# Patient Record
Sex: Male | Born: 1990 | Race: White | Hispanic: No | Marital: Single | Smoking: Current every day smoker
Health system: Southern US, Community
[De-identification: ages and names within clinical notes are randomized; demographics above are authoritative.]

## PROBLEM LIST (undated history)

## (undated) DIAGNOSIS — F1990 Other psychoactive substance use, unspecified, uncomplicated: Secondary | ICD-10-CM

## (undated) HISTORY — PX: MANDIBLE FRACTURE SURGERY: SHX706

## (undated) HISTORY — PX: FINGER AMPUTATION: SHX636

---
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
## 2012-07-28 IMAGING — CR DG THORACIC SPINE 3V
3 series · 3 of 3 positions shown · non-contrast
Comparison: None.

CLINICAL DATA: Thoracic spine pain radiating to the right side of
the body.

THORACIC SPINE - 2 VIEW + SWIMMERS

[w swimmers view]
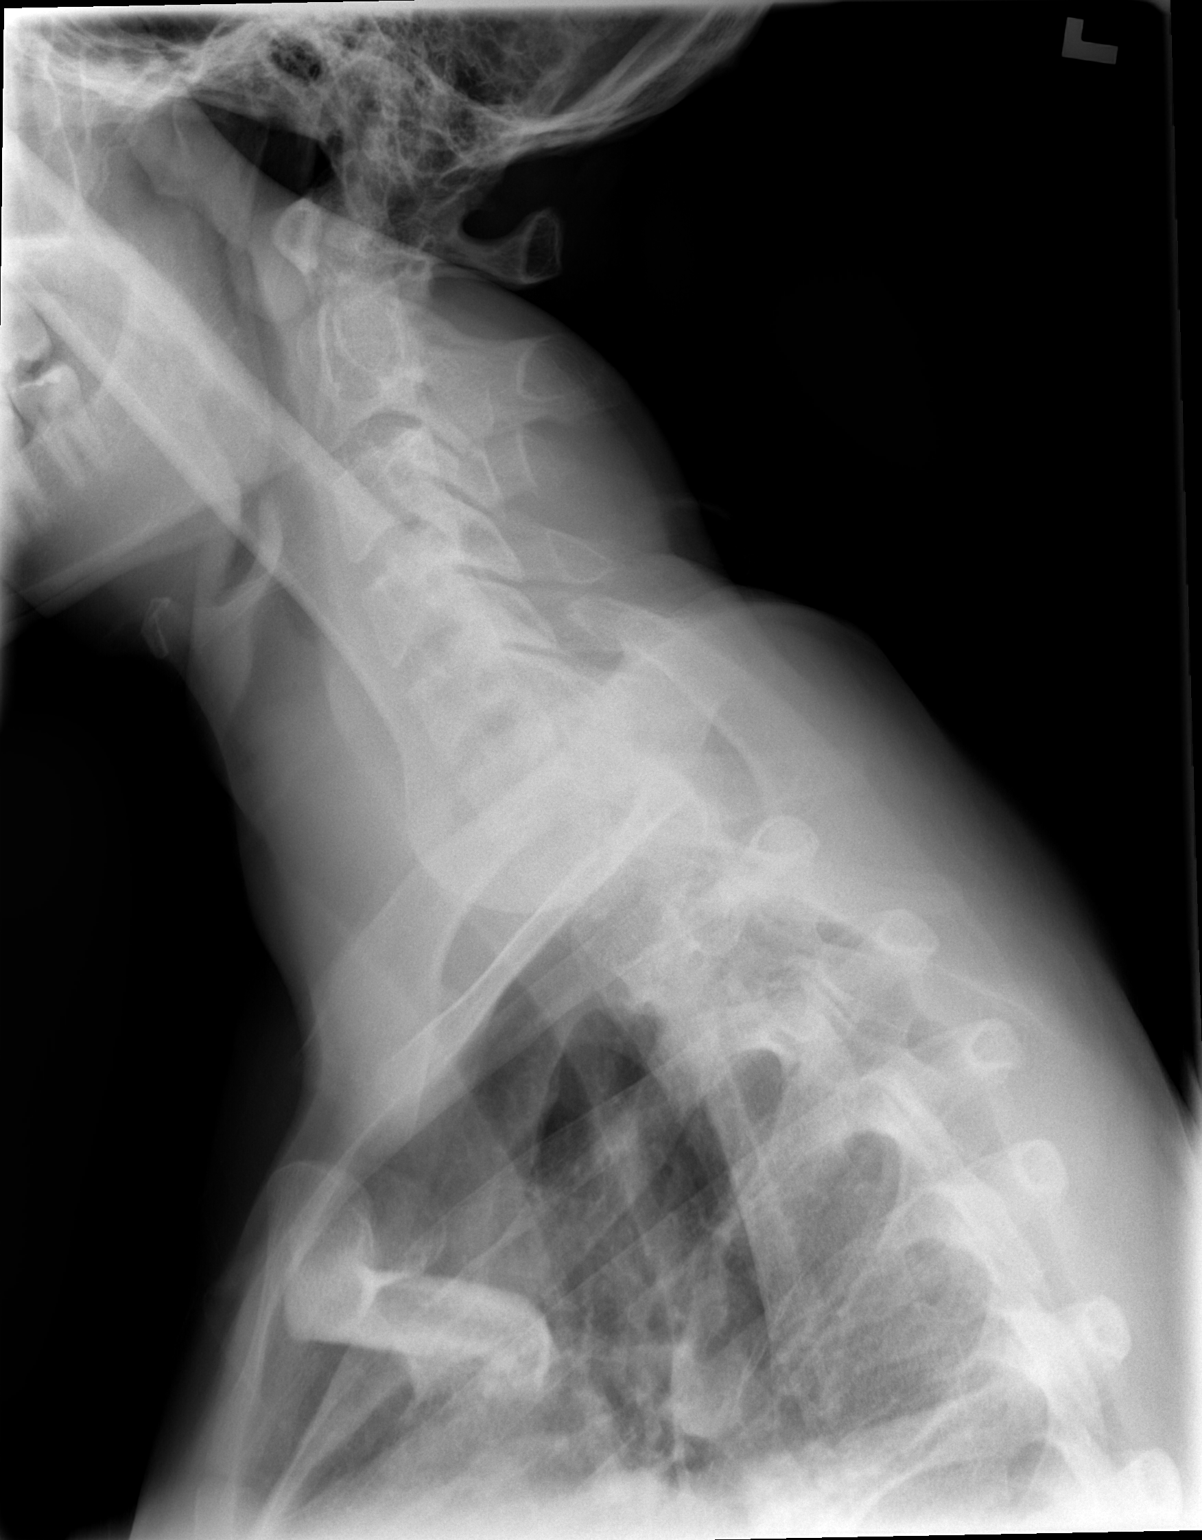

[t t-spine a.p.]
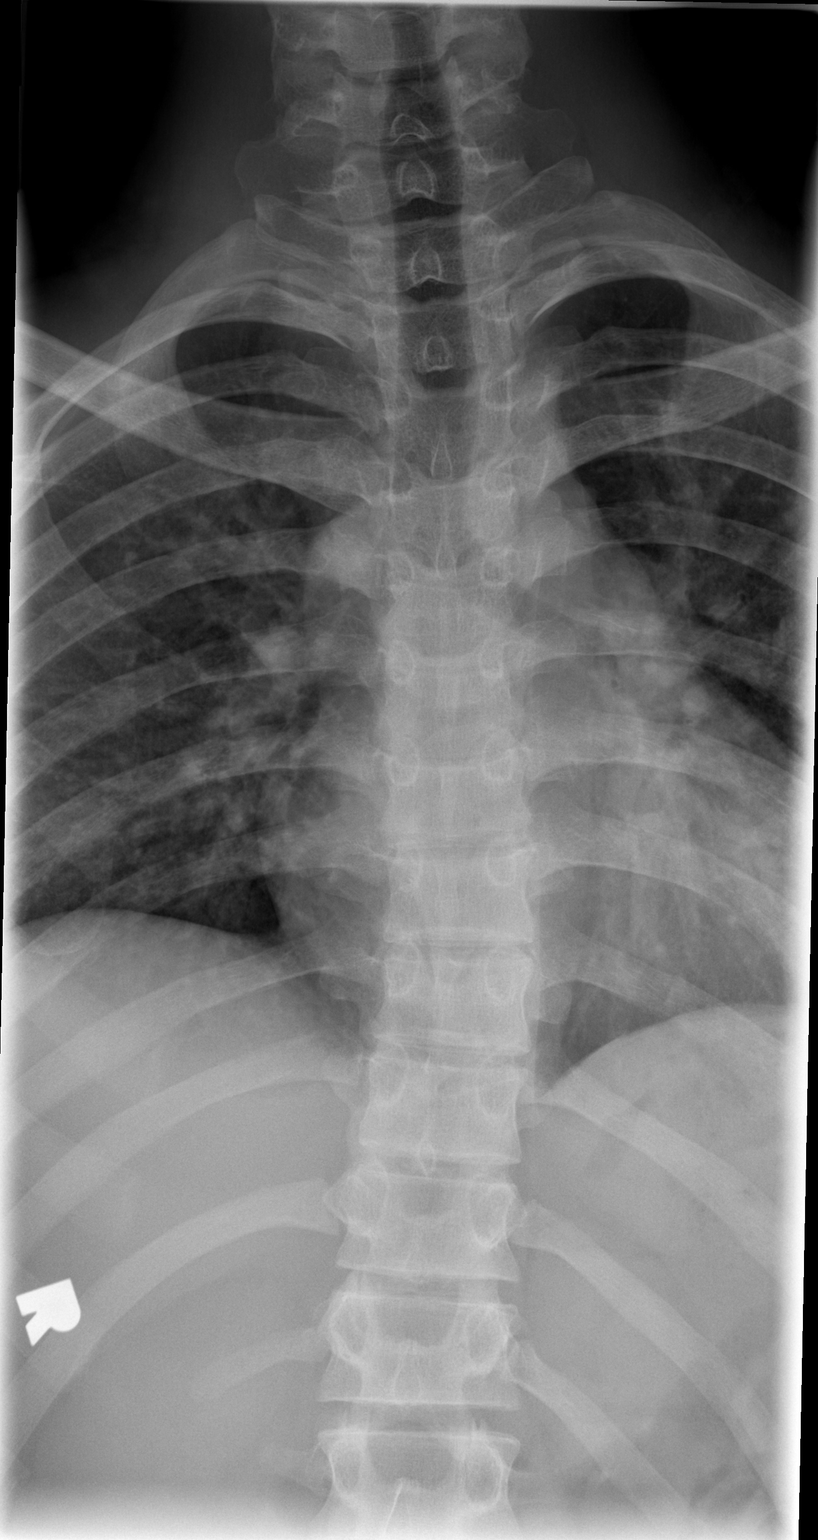

[t t-spine lat]
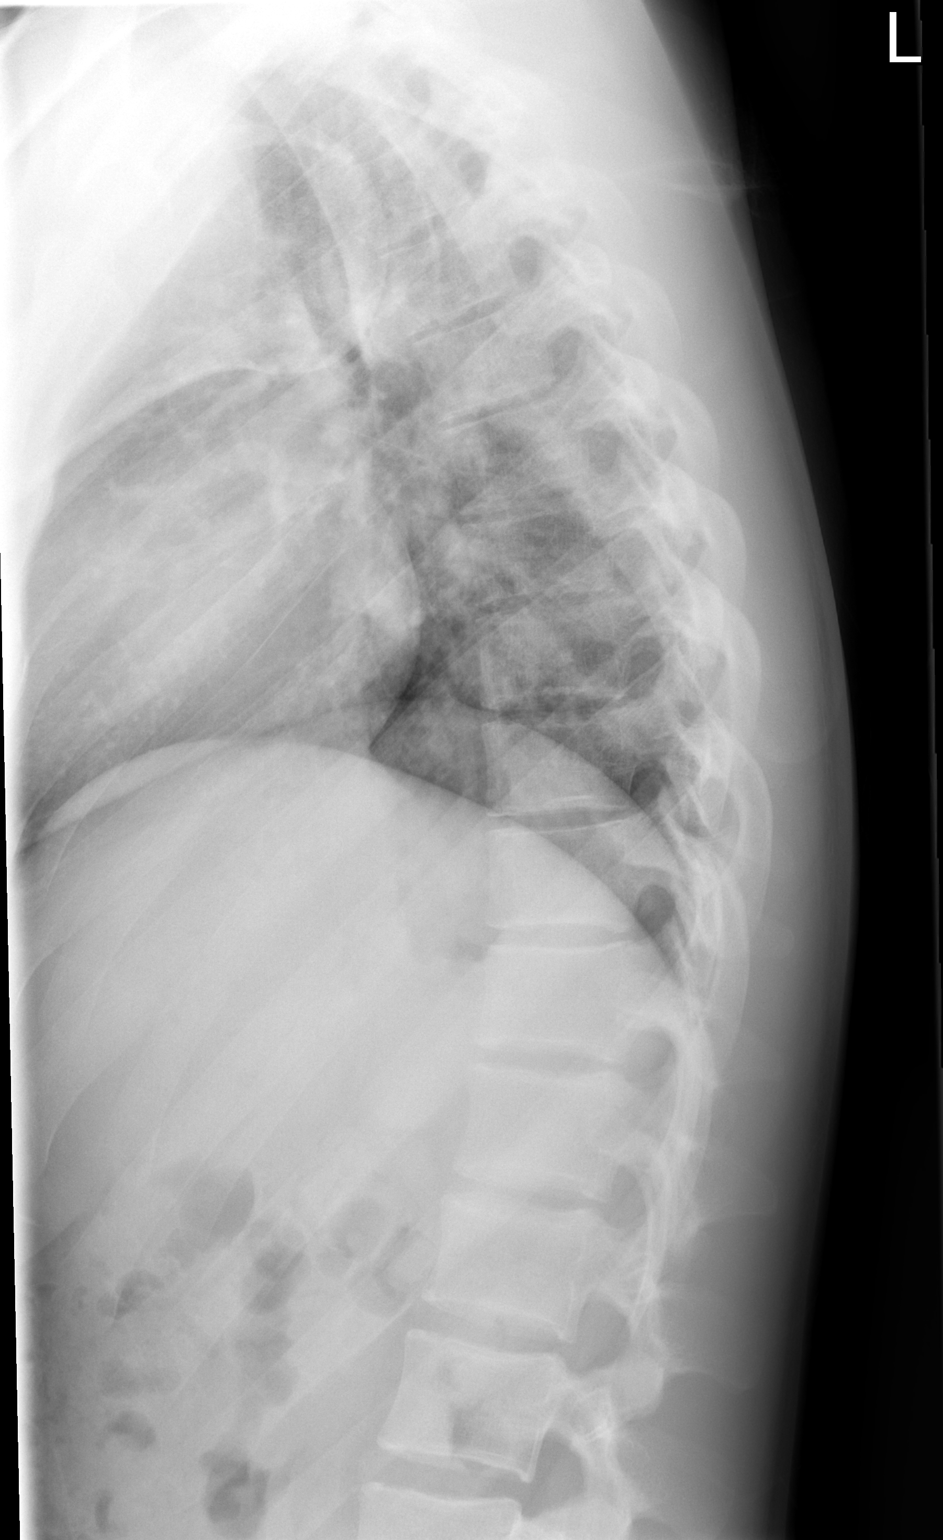

[3 of 3 positions shown; findings below may reference images not displayed]

FINDINGS: Cervicothoracic junction appears within normal limits.
Vertebral body height is preserved.  The alignment on the frontal
view shows a mild levoconvex scoliosis with the apex at T8.  No
thoracic spine fracture is identified.
IMPRESSION: No acute osseous abnormality.  Mild curvature may be positional.

## 2018-11-25 ENCOUNTER — Emergency Department (HOSPITAL_COMMUNITY)
Admission: EM | Admit: 2018-11-25 | Discharge: 2018-11-25 | Disposition: A | Discharge status: Home / Self Care | Payer: Self-pay | Attending: Emergency Medicine | Admitting: Emergency Medicine

## 2018-11-25 ENCOUNTER — Other Ambulatory Visit: Payer: Self-pay

## 2018-11-25 ENCOUNTER — Encounter (HOSPITAL_COMMUNITY): Payer: Self-pay | Admitting: Emergency Medicine

## 2018-11-25 DIAGNOSIS — F17210 Nicotine dependence, cigarettes, uncomplicated: Secondary | ICD-10-CM | POA: Insufficient documentation

## 2018-11-25 DIAGNOSIS — T50901A Poisoning by unspecified drugs, medicaments and biological substances, accidental (unintentional), initial encounter: Secondary | ICD-10-CM | POA: Insufficient documentation

## 2018-11-25 HISTORY — DX: Other psychoactive substance use, unspecified, uncomplicated: F19.90

## 2018-11-25 NOTE — ED Triage Notes (Signed)
Pt admits to shooting up heroin and was given CPR by bystanders, pt was still breathing. Was given 1 of Narcan by EMS and is alert and oriented. States he used 20-30 mL of heroin, last used 2 hours before this incident. Refused transport on scene and was told by PD he had to come to the ED for evaluation. Pt is alert and oriented, maintaining airway patency, states he will stay to be evaluated.

## 2018-11-25 NOTE — ED Provider Notes (Signed)
[facility] EMERGENCY DEPARTMENT Provider Note   CSN: [id number] Arrival date & time: 11/25/18  1456    History   Chief Complaint Chief Complaint  Patient presents with  . Drug Overdose    HPI Manuel Baldwin is a 28 y.o. male.     HPI   28 year old male brought in by EMS after unintentional heroin overdose.  History of IV drug use.  Found unresponsive and was getting bystander CPR on EMS arrival.  He did have some shallow respirations though.  He was given 1 mg of Narcan by EMS with a brisk clinical response.  He is now alert acting appropriately.  Initially refusing transport but then coming after police spoke with him.  Denies any thoughts of trying to harm himself intentionally.  He is not homicidal and he denies any hallucinations.  Past Medical History:  Diagnosis Date  . IV drug user     There are no active problems to display for this patient.   Past Surgical History:  Procedure Laterality Date  . FINGER AMPUTATION    . MANDIBLE FRACTURE SURGERY          Home Medications    Prior to Admission medications   Not on File    Family History History reviewed. No pertinent family history.  Social History Social History   Tobacco Use  . Smoking status: Current Every Day Smoker    Packs/day: 1.00    Types: Cigarettes  . Smokeless tobacco: Never Used  Substance Use Topics  . Alcohol use: Yes    Comment: 2-3 per week  . Drug use: Yes    Types: IV, Marijuana     Allergies   Patient has no known allergies.   Review of Systems Review of Systems All systems reviewed and negative, other than as noted in HPI.   Physical Exam Updated Vital Signs BP 125/82 (BP Location: Right Arm)   Pulse (!) 101   Temp 98.1 F (36.7 C) (Oral)   Resp 12   Ht 5\' 11"  (1.803 m)   Wt 74.8 kg   SpO2 95%   BMI 23.01 kg/m   Physical Exam Vitals signs and nursing note reviewed.  Constitutional:      General: He is not in acute distress.    Appearance: He is  well-developed.     Comments: Disheveled appearance.  Track marks.  Does not seem distressed at all.  HENT:     Head: Normocephalic and atraumatic.  Eyes:     General:        Right eye: No discharge.        Left eye: No discharge.     Conjunctiva/sclera: Conjunctivae normal.  Neck:     Musculoskeletal: Neck supple.  Cardiovascular:     Rate and Rhythm: Normal rate and regular rhythm.     Heart sounds: Normal heart sounds. No murmur. No friction rub. No gallop.   Pulmonary:     Effort: Pulmonary effort is normal. No respiratory distress.     Breath sounds: Normal breath sounds.  Abdominal:     General: There is no distension.     Palpations: Abdomen is soft.     Tenderness: There is no abdominal tenderness.  Musculoskeletal:        General: No tenderness.  Skin:    General: Skin is warm and dry.  Neurological:     Mental Status: He is alert.  Psychiatric:        Behavior: Behavior normal.  Thought Content: Thought content normal.      ED Treatments / Results  Labs (all labs ordered are listed, but only abnormal results are displayed) Labs Reviewed - No data to display  EKG None  Radiology No results found.  Procedures Procedures (including critical care time)  Medications Ordered in ED Medications - No data to display   Initial Impression / Assessment and Plan / ED Course  I have reviewed the triage vital signs and the nursing notes.  Pertinent labs & imaging results that were available during my care of the patient were reviewed by me and considered in my medical decision making (see chart for details).    28 year old male with unintentional heroin overdose.  Response to Narcan by EMS.  He was observed in the emergency room for couple more hours with no deterioration.  And requesting to leave.  I think he is probably safe at this point.  Strict return precautions were discussed.  He was provided resources list for substance abuse.  5:05 PM Sleeping  on re-check. Awakens easily. No new complaints.   Final Clinical Impressions(s) / ED Diagnoses   Final diagnoses:  Accidental drug overdose, initial encounter    ED Discharge Orders    None       Raeford Razor, MD 11/25/18 [phone]

## 2018-11-25 NOTE — ED Notes (Signed)
Pt states he does not want to be evaluated he just needs a ride home

## 2018-11-25 NOTE — Discharge Instructions (Addendum)
Don't do drugs.  

## 2018-11-25 NOTE — ED Notes (Signed)
Pt alert and oriented.  Verbalized understanding of d/c instructions.  States he does not wish to be seen and understands the narcan may wear off and he could become unresponsive and possibly die.  Ride called, ambulatory out of department with steady gait.  Signature pad did not work to sign.

## 2020-07-07 DEATH — deceased
